# Patient Record
Sex: Female | Born: 1940 | Race: White | Hispanic: No | Marital: Married | Smoking: Never smoker
Health system: Southern US, Community
[De-identification: ages and names within clinical notes are randomized; demographics above are authoritative.]

## PROBLEM LIST (undated history)

## (undated) DIAGNOSIS — E7800 Pure hypercholesterolemia, unspecified: Secondary | ICD-10-CM

## (undated) DIAGNOSIS — I10 Essential (primary) hypertension: Secondary | ICD-10-CM

## (undated) DIAGNOSIS — M1990 Unspecified osteoarthritis, unspecified site: Secondary | ICD-10-CM

## (undated) HISTORY — PX: CATARACT EXTRACTION W/ INTRAOCULAR LENS  IMPLANT, BILATERAL: SHX1307

---

## 2014-07-23 ENCOUNTER — Ambulatory Visit: Payer: Self-pay | Admitting: Family Medicine

## 2015-01-06 ENCOUNTER — Ambulatory Visit: Payer: Self-pay | Admitting: Family Medicine

## 2016-03-15 ENCOUNTER — Other Ambulatory Visit: Payer: Self-pay | Admitting: Family Medicine

## 2016-03-15 DIAGNOSIS — Z1231 Encounter for screening mammogram for malignant neoplasm of breast: Secondary | ICD-10-CM

## 2016-03-17 ENCOUNTER — Ambulatory Visit
Admission: RE | Admit: 2016-03-17 | Discharge: 2016-03-17 | Disposition: A | Discharge status: Home / Self Care | Payer: Medicare Other | Source: Ambulatory Visit | Attending: Family Medicine | Admitting: Family Medicine

## 2016-03-17 ENCOUNTER — Other Ambulatory Visit: Payer: Self-pay | Admitting: Family Medicine

## 2016-03-17 DIAGNOSIS — Z1231 Encounter for screening mammogram for malignant neoplasm of breast: Secondary | ICD-10-CM | POA: Diagnosis present

## 2016-07-22 ENCOUNTER — Emergency Department: Payer: Medicare Other

## 2016-07-22 ENCOUNTER — Encounter: Payer: Self-pay | Admitting: Emergency Medicine

## 2016-07-22 ENCOUNTER — Emergency Department
Admission: EM | Admit: 2016-07-22 | Discharge: 2016-07-22 | Disposition: A | Discharge status: Home / Self Care | Payer: Medicare Other | Attending: Emergency Medicine | Admitting: Emergency Medicine

## 2016-07-22 DIAGNOSIS — I10 Essential (primary) hypertension: Secondary | ICD-10-CM | POA: Insufficient documentation

## 2016-07-22 DIAGNOSIS — M79622 Pain in left upper arm: Secondary | ICD-10-CM | POA: Insufficient documentation

## 2016-07-22 HISTORY — DX: Essential (primary) hypertension: I10

## 2016-07-22 HISTORY — DX: Pure hypercholesterolemia, unspecified: E78.00

## 2016-07-22 LAB — CBC
HEMATOCRIT: 40.6 % (ref 35.0–47.0)
Hemoglobin: 14.1 g/dL (ref 12.0–16.0)
MCH: 29.8 pg (ref 26.0–34.0)
MCHC: 34.8 g/dL (ref 32.0–36.0)
MCV: 85.4 fL (ref 80.0–100.0)
Platelets: 131 10*3/uL — ABNORMAL LOW (ref 150–440)
RBC: 4.75 MIL/uL (ref 3.80–5.20)
RDW: 14.7 % — AB (ref 11.5–14.5)
WBC: 9 10*3/uL (ref 3.6–11.0)

## 2016-07-22 LAB — BASIC METABOLIC PANEL
Anion gap: 8 (ref 5–15)
BUN: 27 mg/dL — AB (ref 6–20)
CHLORIDE: 107 mmol/L (ref 101–111)
CO2: 25 mmol/L (ref 22–32)
Calcium: 9.8 mg/dL (ref 8.9–10.3)
Creatinine, Ser: 1.04 mg/dL — ABNORMAL HIGH (ref 0.44–1.00)
GFR calc Af Amer: 59 mL/min — ABNORMAL LOW (ref >60)
GFR calc non Af Amer: 51 mL/min — ABNORMAL LOW (ref >60)
GLUCOSE: 101 mg/dL — AB (ref 65–99)
POTASSIUM: 4.1 mmol/L (ref 3.5–5.1)
SODIUM: 140 mmol/L (ref 135–145)

## 2016-07-22 LAB — TROPONIN I: Troponin I: <0.03 ng/mL (ref <0.03)

## 2016-07-22 MED ORDER — IBUPROFEN 400 MG PO TABS
400.0000 mg | ORAL_TABLET | Freq: Once | ORAL | Status: DC
  Filled 2016-07-22: qty 1

## 2016-07-22 NOTE — ED Triage Notes (Signed)
Pt c/o left arm pain for 2 days. Denies chest pain and SHOB. Pain worse with palpation but hurts even when not being touched. Has sharp pains inside arm

## 2016-07-22 NOTE — ED Provider Notes (Signed)
[facility] Emergency Department Provider Note  ____________________________________________   I have reviewed the triage vital signs and the nursing notes.   HISTORY  Chief Complaint Arm Pain    HPI Erin Caldwell is a 75 y.o. female who had her flu shot about 4 or 5 days ago and for the last 3 days, subsequently, she has had pain in the triceps muscle underneathwhere the flu shot was no fever no chills no nausea no vomiting no numbness no weakness no decreased pulses, no chest pain or shortness of breath. Hurts when she touches it or ranges the arm.      Past Medical History:  Diagnosis Date  . Hypercholesteremia   . Hypertension     There are no active problems to display for this patient.   History reviewed. No pertinent surgical history.  Prior to Admission medications   Not on File    Allergies Percocet [oxycodone-acetaminophen] and Tetanus toxoids  Family History  Problem Relation Age of Onset  . Breast cancer Sister     7350's  . Breast cancer Maternal Aunt     4850's    Social History Social History  Substance Use Topics  . Smoking status: Never Smoker  . Smokeless tobacco: Not on file  . Alcohol use No    Review of Systems Constitutional: No fever/chills Eyes: No visual changes. ENT: No sore throat. No stiff neck no neck pain Cardiovascular: Denies chest pain. Respiratory: Denies shortness of breath. Gastrointestinal:   no vomiting.  No diarrhea.  No constipation. Genitourinary: Negative for dysuria. Musculoskeletal: Negative lower extremity swelling Skin: Negative for rash. Neurological: Negative for severe headaches, focal weakness or numbness. 10-point ROS otherwise negative.  ____________________________________________   PHYSICAL EXAM:  VITAL SIGNS: ED Triage Vitals [07/22/16 1721]  Enc Vitals Group     BP      Pulse      Resp      Temp      Temp src      SpO2      Weight 154 lb (69.9 kg)     Height  4\' 11"  (1.499 m)     Head Circumference      Peak Flow      Pain Score 3     Pain Loc      Pain Edu?      Excl. in GC?     Constitutional: Alert and oriented. Well appearing and in no acute distress. Mouth/Throat: Mucous membranes are moist.  Oropharynx non-erythematous. Neck: No stridor.   Nontender with no meningismus Cardiovascular: Normal rate, regular rhythm. Grossly normal heart sounds.  Good peripheral circulation. Respiratory: Normal respiratory effort.  No retractions. Lungs CTAB. Abdominal: Soft and nontender. No distention. No guarding no rebound Back:  There is no focal tenderness or step off.  there is no midline tenderness there are no lesions noted. there is no CVA tenderness Musculoskeletal: No lower extremity tenderness, there is a small bruise with a flu shot was given on the left shoulder however, the area is clean dry and intact with no evidence of inflammation cellulitis or swelling, in the area underneath this, in the tricep there is tenderness to palpation. There is no redness or warmth or swelling or fluctuance or induration.. No joint effusions, no DVT signs strong distal pulses no edema. Strong pulses compartments soft.  Neurologic:  Normal speech and language. No gross focal neurologic deficits are appreciated.  Skin:  Skin is warm, dry and intact. No rash noted. Psychiatric:  Mood and affect are normal. Speech and behavior are normal.  ____________________________________________   LABS (all labs ordered are listed, but only abnormal results are displayed)  Labs Reviewed  BASIC METABOLIC PANEL - Abnormal; Notable for the following:       Result Value   Glucose, Bld 101 (*)    BUN 27 (*)    Creatinine, Ser 1.04 (*)    GFR calc non Af Amer 51 (*)    GFR calc Af Amer 59 (*)    All other components within normal limits  CBC - Abnormal; Notable for the following:    RDW 14.7 (*)    Platelets 131 (*)    All other components within normal limits  TROPONIN I    ____________________________________________  EKG  I personally interpreted any EKGs ordered by me or triage nsr rate 61 no ste no std no ischemic changes ____________________________________________  RADIOLOGY  I reviewed any imaging ordered by me or triage that were performed during my shift and, if possible, patient and/or family made aware of any abnormal findings. ____________________________________________   PROCEDURES  Procedure(s) performed: None  Procedures  Critical Care performed: None  ____________________________________________   INITIAL IMPRESSION / ASSESSMENT AND PLAN / ED COURSE  Pertinent labs & imaging results that were available during my care of the patient were reviewed by me and considered in my medical decision making (see chart for details).  Pt with arm pain after flu shot. No evidence of acs pe dissection infection or compartment syndrome. Will suggest nsaids and rest and f/u.   Clinical Course   ____________________________________________   FINAL CLINICAL IMPRESSION(S) / ED DIAGNOSES  Final diagnoses:  None      This chart was dictated using voice recognition software.  Despite best efforts to proofread,  errors can occur which can change meaning.      Jeanmarie Plant, MD 07/22/16 2022

## 2016-08-12 ENCOUNTER — Ambulatory Visit (INDEPENDENT_AMBULATORY_CARE_PROVIDER_SITE_OTHER): Payer: Medicare Other | Admitting: Vascular Surgery

## 2016-08-12 ENCOUNTER — Encounter (INDEPENDENT_AMBULATORY_CARE_PROVIDER_SITE_OTHER): Payer: Self-pay | Admitting: Vascular Surgery

## 2016-08-12 DIAGNOSIS — I6523 Occlusion and stenosis of bilateral carotid arteries: Secondary | ICD-10-CM | POA: Diagnosis not present

## 2016-08-12 DIAGNOSIS — I10 Essential (primary) hypertension: Secondary | ICD-10-CM

## 2016-08-12 DIAGNOSIS — I739 Peripheral vascular disease, unspecified: Secondary | ICD-10-CM | POA: Diagnosis not present

## 2016-08-12 DIAGNOSIS — E7800 Pure hypercholesterolemia, unspecified: Secondary | ICD-10-CM

## 2016-08-13 DIAGNOSIS — I6529 Occlusion and stenosis of unspecified carotid artery: Secondary | ICD-10-CM | POA: Insufficient documentation

## 2016-08-13 DIAGNOSIS — I872 Venous insufficiency (chronic) (peripheral): Secondary | ICD-10-CM | POA: Insufficient documentation

## 2016-08-13 DIAGNOSIS — I10 Essential (primary) hypertension: Secondary | ICD-10-CM | POA: Insufficient documentation

## 2016-08-13 DIAGNOSIS — E7800 Pure hypercholesterolemia, unspecified: Secondary | ICD-10-CM | POA: Insufficient documentation

## 2016-08-13 NOTE — Progress Notes (Signed)
MRN : [id number]  Erin Caldwell is a 75 y.o. (May 28, 1941) female who presents with chief complaint of  Chief Complaint  Patient presents with  . New Patient (Initial Visit)  .  History of Present Illness: The patient is sent to me by Dr. Alphonzo LemmingsMcShane for evaluation of atherosclerotic changes noted on chest x-ray.  She presented to the emergency room several days ago with increasing pain of the right arm after she had a flu shot. As part of that evaluation the patient received a chest x-ray and atherosclerotic changes that is calcifications were noted of the aortic arch.  Patient denies back pain or chest pain. She denies claudication symptoms. She also denies amaurosis fugax, TIA no prior history of CVA.  No past vascular interventions. She does attest to a remote carotid ultrasound which demonstrated "some blockage".  Current Outpatient Prescriptions  Medication Sig Dispense Refill  . lisinopril (PRINIVIL,ZESTRIL) 10 MG tablet Take 10 mg by mouth daily.    Marland Kitchen. aspirin EC 81 MG tablet Take by mouth.    . calcium carbonate (CALCIUM 600) 600 MG TABS tablet Take by mouth.    . simvastatin (ZOCOR) 20 MG tablet     . TOPROL XL 100 MG 24 hr tablet      No current facility-administered medications for this visit.     Past Medical History:  Diagnosis Date  . Hypercholesteremia   . Hypertension     No past surgical history on file.  Social History Social History  Substance Use Topics  . Smoking status: Never Smoker  . Smokeless tobacco: Never Used  . Alcohol use No     Family History Family History  Problem Relation Age of Onset  . Breast cancer Sister     2650's  . Breast cancer Maternal Aunt     50's   No family history of bleeding clotting disorders, porphyria or autoimmune disease.  Allergies  Allergen Reactions  . Percocet [Oxycodone-Acetaminophen] Other (See Comments)    hallucinations  . Tetanus Toxoids Swelling     REVIEW OF SYSTEMS (Negative unless  checked)  Constitutional: [] Weight loss  [] Fever  [] Chills Cardiac: [] Chest pain   [] Chest pressure   [] Palpitations   [] Shortness of breath at rest   [] Shortness of breath with exertion. Vascular:  [] Pain in legs with walking   [] Pain in legs at rest    [] Pain in feet at rest    [] History of DVT   [] Phlebitis   [] Swelling in legs   [] Varicose veins   [] Non-healing ulcers Pulmonary:   [] Uses home oxygen   [] Productive cough   [] Hemoptysis   [] Wheeze  [] COPD   [] Asthma Neurologic:  [] Dizziness  [] Blackouts   [] Seizures   [] History of stroke   [] History of TIA  [] Aphasia   [] Temporary blindness   [] Dysphagia   [] Weakness or numbness in arm   [] Weakness or numbness in leg Musculoskeletal:  [x] Arthritis   [] Joint swelling   [] Joint pain   [] Low back pain Hematologic:  [] Easy bruising  [] Easy bleeding   [] Hypercoagulable state   [] Anemic   Gastrointestinal:  [] Blood in stool   [] Vomiting blood  [] Gastroesophageal reflux/heartburn   [] Difficulty swallowing. Genitourinary:  [] Chronic kidney disease   [] Difficult urination  [] Frequent urination  [] Burning with urination   [] Blood in urine Skin:  [] Rashes   [] Ulcers   Psychological:  [] History of anxiety   []  History of major depression.    Physical Examination  Vitals:   08/12/16 1359  BP: Marland Kitchen(!)  153/66  Pulse: (!) 52  Resp: 16  Weight: 161 lb (73 kg)  Height: 4\' 11"  (1.499 m)   Body mass index is 32.52 kg/m. Gen:  WD/WN, NAD Head: [place]/AT, No temporalis wasting. Ear/Nose/Throat: Hearing grossly intact, nares w/o erythema or drainage, trachea midline Eyes: PERR, EOM appear normal. Sclera non-icteric Neck: Supple, no nuchal rigidity.  No bruit or JVD.  Pulmonary:  Good air movement, equal and clear to auscultation bilaterally.  Cardiac: RRR, normal S1, S2, 3/6 systolic Murmurs, rubs or gallops. Vascular:   Bilateral carotid bruit Vessel Right Left  Radial Palpable Palpable  Ulnar Palpable Palpable  Brachial Palpable Palpable  Carotid  Palpable Palpable  Aorta Not palpable N/A  Femoral Palpable Palpable  Popliteal Palpable Palpable  PT 1+ Palpable 1+ Palpable  DP 2+ Palpable 2+ Palpable   Gastrointestinal: soft, non-rigid/non-distended. No guarding.  Musculoskeletal: M/S 5/5 throughout.  No deformity or atrophy. Neurologic: CN 2-12 intact. Pain and light touch intact in extremities.  Speech is fluent. Motor exam as listed above. Psychiatric: Judgment intact, Mood & affect appropriate for pt's clinical situation. Dermatologic: No rashes or ulcers noted.  No signs consistent with cellulitis. Lymphatic:  No cervical lymphadenopathy  CBC Lab Results  Component Value Date   WBC 9.0 07/22/2016   HGB 14.1 07/22/2016   HCT 40.6 07/22/2016   MCV 85.4 07/22/2016   PLT 131 (L) 07/22/2016    BMET    Component Value Date/Time   NA 140 07/22/2016 1730   K 4.1 07/22/2016 1730   CL 107 07/22/2016 1730   CO2 25 07/22/2016 1730   GLUCOSE 101 (H) 07/22/2016 1730   BUN 27 (H) 07/22/2016 1730   CREATININE 1.04 (H) 07/22/2016 1730   CALCIUM 9.8 07/22/2016 1730   GFRNONAA 51 (L) 07/22/2016 1730   GFRAA 59 (L) 07/22/2016 1730   CrCl cannot be calculated (Patient's most recent lab result is older than the maximum 21 days allowed.).  COAG No results found for: INR, PROTIME  Radiology Dg Chest 2 View  Result Date: 07/22/2016 CLINICAL DATA:  Left arm pain for 2 days. EXAM: CHEST  2 VIEW COMPARISON:  None. FINDINGS: The heart size and mediastinal contours are within normal limits. Aortic atherosclerosis. Both lungs are clear. No evidence of pneumothorax or pleural effusion. Upper and mid thoracic vertebral body compression fracture deformities are noted which are likely old. IMPRESSION: No active cardiopulmonary disease. Aortic atherosclerosis. Probable old upper and mid thoracic vertebral body compression deformities. Electronically Signed   By: Myles Rosenthal M.D.   On: 07/22/2016 18:05      Assessment/Plan 1.   Atherosclerotic occlusive disease nonspecific: Patient does not have symptoms related to her peripheral vascular disease. Although there is mild changes noted on x-ray given the palpable pulses I would not move forward with any further invasive testing. I did discuss the possibility of CT angiogram with her however we're in agreement that this is not needed at this time. Patient will continue antiplatelet therapy aggressive control of cholesterol with statins. She will begin exercise.  2.  Atherosclerotic occlusive disease bilateral carotid arteries: Patient is overdue for duplex ultrasound of the carotid arteries. Carotid ultrasound will be ordered. As noted above she is already on medical management for atherosclerotic occlusive disease and this will continue. She was educated regarding symptoms and signs of carotid stenosis including amaurosis fugax TIA.  3.  Hypertension: Patient will continue her medications. Her blood pressure was mildly elevated and I have encouraged her to have a recheck  and her medications adjusted based on a follow-up I would not jump to changes based on 1 value.  4.  Hypercholesterolemia: Patient will continue her statin therapy.   Levora Dredge, MD  08/13/2016 1:22 PM    This note was created with Dragon medical transcription system.  Any errors from dictation are purely unintentional

## 2016-09-06 ENCOUNTER — Other Ambulatory Visit (INDEPENDENT_AMBULATORY_CARE_PROVIDER_SITE_OTHER): Payer: Self-pay | Admitting: Vascular Surgery

## 2016-09-06 DIAGNOSIS — I6523 Occlusion and stenosis of bilateral carotid arteries: Secondary | ICD-10-CM

## 2016-09-09 ENCOUNTER — Encounter (INDEPENDENT_AMBULATORY_CARE_PROVIDER_SITE_OTHER): Payer: Self-pay | Admitting: Vascular Surgery

## 2016-09-09 ENCOUNTER — Ambulatory Visit (INDEPENDENT_AMBULATORY_CARE_PROVIDER_SITE_OTHER): Payer: Medicare Other | Admitting: Vascular Surgery

## 2016-09-09 ENCOUNTER — Ambulatory Visit (INDEPENDENT_AMBULATORY_CARE_PROVIDER_SITE_OTHER): Payer: Medicare Other

## 2016-09-09 VITALS — BP 179/79 | HR 47 | Resp 16 | Ht 59.0 in | Wt 159.0 lb

## 2016-09-09 DIAGNOSIS — I739 Peripheral vascular disease, unspecified: Secondary | ICD-10-CM | POA: Diagnosis not present

## 2016-09-09 DIAGNOSIS — E7800 Pure hypercholesterolemia, unspecified: Secondary | ICD-10-CM | POA: Diagnosis not present

## 2016-09-09 DIAGNOSIS — I6523 Occlusion and stenosis of bilateral carotid arteries: Secondary | ICD-10-CM | POA: Diagnosis not present

## 2016-09-09 DIAGNOSIS — I10 Essential (primary) hypertension: Secondary | ICD-10-CM

## 2016-09-09 NOTE — Progress Notes (Signed)
[facility] Admission History & Physical  MRN : [id number]  Erin Caldwell is a 75 y.o. ([birth year]) female who presents with chief complaint of  Chief Complaint  Patient presents with  . Follow-up  .  History of Present Illness: The patient is seen for follow up evaluation of carotid stenosis. The carotid stenosis followed by ultrasound.   The patient denies amaurosis fugax. There is no recent history of TIA symptoms or focal motor deficits. There is no prior documented CVA.  The patient is taking enteric-coated aspirin 81 mg daily.  There is no history of migraine headaches. There is no history of seizures.  The patient has a history of coronary artery disease, no recent episodes of angina or shortness of breath. The patient denies PAD or claudication symptoms. There is a history of hyperlipidemia which is being treated with a statin.    Carotid Duplex done today shows <30% bilateral ICA stenosis antegrade vertebral arteries bilaterally.    Current Meds  Medication Sig  . aspirin EC 81 MG tablet Take by mouth.  . calcium carbonate (CALCIUM 600) 600 MG TABS tablet Take by mouth.  Marland Kitchen. lisinopril (PRINIVIL,ZESTRIL) 10 MG tablet Take 10 mg by mouth daily.  . simvastatin (ZOCOR) 20 MG tablet   . TOPROL XL 100 MG 24 hr tablet     Past Medical History:  Diagnosis Date  . Hypercholesteremia   . Hypertension     No past surgical history on file.  Social History Social History  Substance Use Topics  . Smoking status: Never Smoker  . Smokeless tobacco: Never Used  . Alcohol use No    Family History Family History  Problem Relation Age of Onset  . Breast cancer Sister     4450's  . Breast cancer Maternal Aunt     50's    Allergies  Allergen Reactions  . Percocet [Oxycodone-Acetaminophen] Other (See Comments)    hallucinations  . Tetanus Toxoids Swelling     REVIEW OF SYSTEMS (Negative unless checked)  Constitutional: [] Weight loss   [] Fever  [] Chills Cardiac: [] Chest pain   [] Chest pressure   [] Palpitations   [] Shortness of breath when laying flat   [] Shortness of breath with exertion. Vascular:  [] Pain in legs with walking   [] Pain in legs at rest  [] History of DVT   [] Phlebitis   [] Swelling in legs   [] Varicose veins   [] Non-healing ulcers Pulmonary:   [] Uses home oxygen   [] Productive cough   [] Hemoptysis   [] Wheeze  [] COPD   [] Asthma Neurologic:  [] Dizziness   [] Seizures   [] History of stroke   [] History of TIA  [] Aphasia   [] Vissual changes   [] Weakness or numbness in arm   [] Weakness or numbness in leg Musculoskeletal:   [] Joint swelling   [] Joint pain   [] Low back pain Hematologic:  [] Easy bruising  [] Easy bleeding   [] Hypercoagulable state   [] Anemic Gastrointestinal:  [] Diarrhea   [] Vomiting  [] Gastroesophageal reflux/heartburn   [] Difficulty swallowing. Genitourinary:  [] Chronic kidney disease   [] Difficult urination  [] Frequent urination   [] Blood in urine Skin:  [] Rashes   [] Ulcers  Psychological:  [] History of anxiety   []  History of major depression.  Physical Examination  Vitals:   09/09/16 1121  BP: (!) 179/79  Pulse: (!) 47  Resp: 16  Weight: 159 lb (72.1 kg)  Height: 4\' 11"  (1.499 m)   Body mass index is 32.11 kg/m.   Gen:  WD/WN, NAD Head: [place]/AT, No temporalis wasting.  Ear/Nose/Throat: Hearing grossly intact, nares w/o erythema or drainage, trachea midline Eyes: PERR, EOM appear normal. Sclera non-icteric Neck: Supple, no nuchal rigidity.  No bruit or JVD.  Pulmonary:  Good air movement, equal and clear to auscultation bilaterally.  Cardiac: RRR, normal S1, S2, 3/6 systolic Murmurs, rubs or gallops. Vascular:   Bilateral carotid bruit Vessel Right Left  Radial Palpable Palpable  Ulnar Palpable Palpable  Brachial Palpable Palpable  Carotid Palpable Palpable  Aorta Not palpable N/A  Femoral Palpable Palpable  Popliteal Palpable Palpable  PT 1+ Palpable 1+ Palpable  DP 2+ Palpable 2+  Palpable   Gastrointestinal: soft, non-rigid/non-distended. No guarding.  Musculoskeletal: M/S 5/5 throughout.  No deformity or atrophy. Neurologic: CN 2-12 intact. Pain and light touch intact in extremities.  Speech is fluent. Motor exam as listed above. Psychiatric: Judgment intact, Mood & affect appropriate for pt's clinical situation. Dermatologic: No rashes or ulcers noted.  No signs consistent with cellulitis. Lymphatic:  No cervical lymphadenopathy    CBC Lab Results  Component Value Date   WBC 9.0 07/22/2016   HGB 14.1 07/22/2016   HCT 40.6 07/22/2016   MCV 85.4 07/22/2016   PLT 131 (L) 07/22/2016    BMET    Component Value Date/Time   NA 140 07/22/2016 1730   K 4.1 07/22/2016 1730   CL 107 07/22/2016 1730   CO2 25 07/22/2016 1730   GLUCOSE 101 (H) 07/22/2016 1730   BUN 27 (H) 07/22/2016 1730   CREATININE 1.04 (H) 07/22/2016 1730   CALCIUM 9.8 07/22/2016 1730   GFRNONAA 51 (L) 07/22/2016 1730   GFRAA 59 (L) 07/22/2016 1730   CrCl cannot be calculated (Patient's most recent lab result is older than the maximum 21 days allowed.).  COAG No results found for: INR, PROTIME  Radiology No results found.   Assessment/Plan 1.  Atherosclerotic occlusive disease nonspecific: Patient does not have symptoms related to her peripheral vascular disease. Although there is mild changes noted on x-ray given the palpable pulses I would not move forward with any further invasive testing. I did discuss the possibility of CT angiogram with her however we're in agreement that this is not needed at this time. Patient will continue antiplatelet therapy aggressive control of cholesterol with statins. She will begin exercise.  2.  Atherosclerotic occlusive disease bilateral carotid arteries:  She has minimal carotid plaque and this can be followed as needed  3.  Hypertension: Patient will continue her medications. Her blood pressure was mildly elevated and I have encouraged her to  have a recheck and her medications adjusted based on a follow-up I would not jump to changes based on 1 value.  4.  Hypercholesterolemia: Patient will continue her statin therapy.   Levora DredgeGregory Jaysion Ramseyer, MD  09/09/2016 11:59 AM

## 2016-12-07 ENCOUNTER — Other Ambulatory Visit: Payer: Self-pay | Admitting: Family Medicine

## 2016-12-07 DIAGNOSIS — S22000A Wedge compression fracture of unspecified thoracic vertebra, initial encounter for closed fracture: Secondary | ICD-10-CM

## 2016-12-15 ENCOUNTER — Ambulatory Visit
Admission: RE | Admit: 2016-12-15 | Discharge: 2016-12-15 | Disposition: A | Discharge status: Home / Self Care | Payer: Medicare Other | Source: Ambulatory Visit | Attending: Family Medicine | Admitting: Family Medicine

## 2016-12-15 DIAGNOSIS — M4854XA Collapsed vertebra, not elsewhere classified, thoracic region, initial encounter for fracture: Secondary | ICD-10-CM | POA: Diagnosis present

## 2016-12-15 DIAGNOSIS — S22000A Wedge compression fracture of unspecified thoracic vertebra, initial encounter for closed fracture: Secondary | ICD-10-CM

## 2016-12-22 ENCOUNTER — Encounter
Admission: RE | Admit: 2016-12-22 | Discharge: 2016-12-22 | Disposition: A | Discharge status: Home / Self Care | Payer: Medicare Other | Source: Ambulatory Visit | Attending: Orthopedic Surgery | Admitting: Orthopedic Surgery

## 2016-12-22 DIAGNOSIS — Z7982 Long term (current) use of aspirin: Secondary | ICD-10-CM | POA: Diagnosis not present

## 2016-12-22 DIAGNOSIS — M810 Age-related osteoporosis without current pathological fracture: Secondary | ICD-10-CM | POA: Diagnosis not present

## 2016-12-22 DIAGNOSIS — S22060A Wedge compression fracture of T7-T8 vertebra, initial encounter for closed fracture: Secondary | ICD-10-CM | POA: Diagnosis not present

## 2016-12-22 DIAGNOSIS — M4854XA Collapsed vertebra, not elsewhere classified, thoracic region, initial encounter for fracture: Secondary | ICD-10-CM | POA: Diagnosis present

## 2016-12-22 DIAGNOSIS — X58XXXA Exposure to other specified factors, initial encounter: Secondary | ICD-10-CM | POA: Diagnosis not present

## 2016-12-22 DIAGNOSIS — Z01812 Encounter for preprocedural laboratory examination: Secondary | ICD-10-CM

## 2016-12-22 DIAGNOSIS — Z79899 Other long term (current) drug therapy: Secondary | ICD-10-CM | POA: Diagnosis not present

## 2016-12-22 DIAGNOSIS — E785 Hyperlipidemia, unspecified: Secondary | ICD-10-CM | POA: Diagnosis not present

## 2016-12-22 DIAGNOSIS — E7800 Pure hypercholesterolemia, unspecified: Secondary | ICD-10-CM | POA: Diagnosis not present

## 2016-12-22 DIAGNOSIS — I10 Essential (primary) hypertension: Secondary | ICD-10-CM | POA: Diagnosis not present

## 2016-12-22 HISTORY — DX: Unspecified osteoarthritis, unspecified site: M19.90

## 2016-12-22 LAB — BASIC METABOLIC PANEL
Anion gap: 6 (ref 5–15)
BUN: 26 mg/dL — AB (ref 6–20)
CHLORIDE: 107 mmol/L (ref 101–111)
CO2: 29 mmol/L (ref 22–32)
Calcium: 9.6 mg/dL (ref 8.9–10.3)
Creatinine, Ser: 1.05 mg/dL — ABNORMAL HIGH (ref 0.44–1.00)
GFR calc Af Amer: 58 mL/min — ABNORMAL LOW (ref >60)
GFR calc non Af Amer: 50 mL/min — ABNORMAL LOW (ref >60)
Glucose, Bld: 92 mg/dL (ref 65–99)
POTASSIUM: 4.4 mmol/L (ref 3.5–5.1)
SODIUM: 142 mmol/L (ref 135–145)

## 2016-12-22 LAB — CBC
HEMATOCRIT: 39 % (ref 35.0–47.0)
HEMOGLOBIN: 13.1 g/dL (ref 12.0–16.0)
MCH: 28.5 pg (ref 26.0–34.0)
MCHC: 33.6 g/dL (ref 32.0–36.0)
MCV: 84.9 fL (ref 80.0–100.0)
Platelets: 158 10*3/uL (ref 150–440)
RBC: 4.6 MIL/uL (ref 3.80–5.20)
RDW: 14.4 % (ref 11.5–14.5)
WBC: 7.9 10*3/uL (ref 3.6–11.0)

## 2016-12-22 LAB — SURGICAL PCR SCREEN
MRSA, PCR: NEGATIVE
Staphylococcus aureus: NEGATIVE

## 2016-12-22 MED ORDER — CEFAZOLIN SODIUM-DEXTROSE 2-4 GM/100ML-% IV SOLN
2.0000 g | Freq: Once | INTRAVENOUS | Status: AC
  Administered 2016-12-23: 2 g via INTRAVENOUS

## 2016-12-22 NOTE — Patient Instructions (Signed)
  Your procedure is scheduled on: 12/23/16 Thurs  Report to Same Day Surgery 2nd floor medical mall [facility](Medical Mall Entrance-take elevator on left to 2nd floor.  Check in with surgery information desk.) arrive at 9:50am    Remember: Instructions that are not followed completely may result in serious medical risk, up to and including death, or upon the discretion of your surgeon and anesthesiologist your surgery may need to be rescheduled.    _x___ 1. Do not eat food or drink liquids after midnight. No gum chewing or hard candies.     __x__ 2. No Alcohol for 24 hours before or after surgery.   __x__3. No Smoking for 24 prior to surgery.   ____  4. Bring all medications with you on the day of surgery if instructed.    __x__ 5. Notify your doctor if there is any change in your medical condition     (cold, fever, infections).     Do not wear jewelry, make-up, hairpins, clips or nail polish.  Do not wear lotions, powders, or perfumes. You may wear deodorant.  Do not shave 48 hours prior to surgery. Men may shave face and neck.  Do not bring valuables to the hospital.    [facility] is not responsible for any belongings or valuables.               Contacts, dentures or bridgework may not be worn into surgery.  Leave your suitcase in the car. After surgery it may be brought to your room.  For patients admitted to the hospital, discharge time is determined by your treatment team.   Patients discharged the day of surgery will not be allowed to drive home.  You will need someone to drive you home and stay with you the night of your procedure.    Please read over the following fact sheets that you were given:   [facility] Preparing for [facility] and or MRSA Information   _x___ Take these medicines the morning of surgery with A SIP OF WATER:    1. lisinopril (PRINIVIL,ZESTRIL) 10   2.  3.  4.  5.  6.  ____Fleets enema or Magnesium Citrate as directed.   _x___ Use CHG Soap or sage wipes as  directed on instruction sheet   ____ Use inhalers on the day of surgery and bring to hospital day of surgery  ____ Stop metformin 2 days prior to surgery    ____ Take 1/2 of usual insulin dose the night before surgery and none on the morning of           surgery.   _x___ Stop Aspirin, Coumadin, Pllavix ,Eliquis, Effient, or Pradaxa  Stopped Aspirin and meloxicam a week ago  x__ Stop Anti-inflammatories such as Advil, Aleve, Ibuprofen, Motrin, Naproxen,          Naprosyn, Goodies powders or aspirin products. Ok to take Tylenol.  Stopped naproxen yesterday.   ____ Stop supplements until after surgery.    ____ Bring C-Pap to the hospital.

## 2016-12-23 ENCOUNTER — Ambulatory Visit: Payer: Medicare Other | Admitting: Anesthesiology

## 2016-12-23 ENCOUNTER — Encounter
Admission: RE | Disposition: A | Discharge status: Home / Self Care | Payer: Self-pay | Source: Ambulatory Visit | Attending: Orthopedic Surgery

## 2016-12-23 ENCOUNTER — Ambulatory Visit
Admission: RE | Admit: 2016-12-23 | Discharge: 2016-12-23 | Disposition: A | Discharge status: Home / Self Care | Payer: Medicare Other | Source: Ambulatory Visit | Attending: Orthopedic Surgery | Admitting: Orthopedic Surgery

## 2016-12-23 ENCOUNTER — Ambulatory Visit: Payer: Medicare Other

## 2016-12-23 ENCOUNTER — Encounter: Payer: Self-pay | Admitting: *Deleted

## 2016-12-23 DIAGNOSIS — E785 Hyperlipidemia, unspecified: Secondary | ICD-10-CM | POA: Insufficient documentation

## 2016-12-23 DIAGNOSIS — S22060A Wedge compression fracture of T7-T8 vertebra, initial encounter for closed fracture: Secondary | ICD-10-CM | POA: Diagnosis not present

## 2016-12-23 DIAGNOSIS — Z79899 Other long term (current) drug therapy: Secondary | ICD-10-CM | POA: Insufficient documentation

## 2016-12-23 DIAGNOSIS — E7800 Pure hypercholesterolemia, unspecified: Secondary | ICD-10-CM | POA: Insufficient documentation

## 2016-12-23 DIAGNOSIS — Z7982 Long term (current) use of aspirin: Secondary | ICD-10-CM | POA: Insufficient documentation

## 2016-12-23 DIAGNOSIS — Z419 Encounter for procedure for purposes other than remedying health state, unspecified: Secondary | ICD-10-CM

## 2016-12-23 DIAGNOSIS — M810 Age-related osteoporosis without current pathological fracture: Secondary | ICD-10-CM | POA: Insufficient documentation

## 2016-12-23 DIAGNOSIS — I10 Essential (primary) hypertension: Secondary | ICD-10-CM | POA: Insufficient documentation

## 2016-12-23 DIAGNOSIS — X58XXXA Exposure to other specified factors, initial encounter: Secondary | ICD-10-CM | POA: Insufficient documentation

## 2016-12-23 HISTORY — PX: KYPHOPLASTY: SHX5884

## 2016-12-23 SURGERY — KYPHOPLASTY
Anesthesia: General | Wound class: Clean

## 2016-12-23 MED ORDER — MIDAZOLAM HCL 2 MG/2ML IJ SOLN
INTRAMUSCULAR | Status: AC
  Filled 2016-12-23: qty 2

## 2016-12-23 MED ORDER — LACTATED RINGERS IV SOLN
INTRAVENOUS | Status: DC
  Administered 2016-12-23: 13:00:00 via INTRAVENOUS

## 2016-12-23 MED ORDER — LIDOCAINE HCL (PF) 1 % IJ SOLN
INTRAMUSCULAR | Status: DC | PRN
  Administered 2016-12-23 (×2): 10 mL

## 2016-12-23 MED ORDER — IOPAMIDOL (ISOVUE-M 200) INJECTION 41%
INTRAMUSCULAR | Status: AC
  Filled 2016-12-23: qty 20

## 2016-12-23 MED ORDER — FENTANYL CITRATE (PF) 100 MCG/2ML IJ SOLN: 25.0000 ug | INTRAMUSCULAR | Status: DC | PRN

## 2016-12-23 MED ORDER — BUPIVACAINE-EPINEPHRINE (PF) 0.5% -1:200000 IJ SOLN
INTRAMUSCULAR | Status: AC
  Filled 2016-12-23: qty 30

## 2016-12-23 MED ORDER — FENTANYL CITRATE (PF) 100 MCG/2ML IJ SOLN
INTRAMUSCULAR | Status: DC | PRN
  Administered 2016-12-23: 25 ug via INTRAVENOUS
  Administered 2016-12-23: 50 ug via INTRAVENOUS
  Administered 2016-12-23: 25 ug via INTRAVENOUS

## 2016-12-23 MED ORDER — LIDOCAINE HCL (PF) 1 % IJ SOLN
INTRAMUSCULAR | Status: AC
  Filled 2016-12-23: qty 60

## 2016-12-23 MED ORDER — ONDANSETRON HCL 4 MG/2ML IJ SOLN: 4.0000 mg | Freq: Once | INTRAMUSCULAR | Status: DC | PRN

## 2016-12-23 MED ORDER — HYDROCODONE-ACETAMINOPHEN 5-325 MG PO TABS: 1.0000 | ORAL_TABLET | ORAL | Status: DC | PRN

## 2016-12-23 MED ORDER — MIDAZOLAM HCL 2 MG/2ML IJ SOLN
INTRAMUSCULAR | Status: DC | PRN
  Administered 2016-12-23 (×2): 1 mg via INTRAVENOUS

## 2016-12-23 MED ORDER — HYDROCODONE-ACETAMINOPHEN 5-325 MG PO TABS: 1.0000 | ORAL_TABLET | Freq: Four times a day (QID) | ORAL | 0 refills | Status: AC | PRN

## 2016-12-23 MED ORDER — PROPOFOL 500 MG/50ML IV EMUL
INTRAVENOUS | Status: AC
  Filled 2016-12-23: qty 50

## 2016-12-23 MED ORDER — PROPOFOL 500 MG/50ML IV EMUL
INTRAVENOUS | Status: DC | PRN
  Administered 2016-12-23: 50 ug/kg/min via INTRAVENOUS

## 2016-12-23 MED ORDER — FAMOTIDINE 20 MG PO TABS
ORAL_TABLET | ORAL | Status: AC
  Filled 2016-12-23: qty 1

## 2016-12-23 MED ORDER — METOCLOPRAMIDE HCL 10 MG PO TABS: 5.0000 mg | ORAL_TABLET | Freq: Three times a day (TID) | ORAL | Status: DC | PRN

## 2016-12-23 MED ORDER — LIDOCAINE HCL (PF) 2 % IJ SOLN
INTRAMUSCULAR | Status: AC
  Filled 2016-12-23: qty 2

## 2016-12-23 MED ORDER — METOCLOPRAMIDE HCL 5 MG/ML IJ SOLN: 5.0000 mg | Freq: Three times a day (TID) | INTRAMUSCULAR | Status: DC | PRN

## 2016-12-23 MED ORDER — CEFAZOLIN SODIUM-DEXTROSE 2-4 GM/100ML-% IV SOLN
INTRAVENOUS | Status: AC
  Filled 2016-12-23: qty 100

## 2016-12-23 MED ORDER — ONDANSETRON HCL 4 MG PO TABS: 4.0000 mg | ORAL_TABLET | Freq: Four times a day (QID) | ORAL | Status: DC | PRN

## 2016-12-23 MED ORDER — BUPIVACAINE-EPINEPHRINE (PF) 0.5% -1:200000 IJ SOLN
INTRAMUSCULAR | Status: DC | PRN
  Administered 2016-12-23: 10 mL

## 2016-12-23 MED ORDER — LIDOCAINE HCL (CARDIAC) 20 MG/ML IV SOLN
INTRAVENOUS | Status: DC | PRN
  Administered 2016-12-23: 60 mg via INTRAVENOUS

## 2016-12-23 MED ORDER — PHENYLEPHRINE HCL 10 MG/ML IJ SOLN
INTRAMUSCULAR | Status: DC | PRN
  Administered 2016-12-23: 50 ug via INTRAVENOUS

## 2016-12-23 MED ORDER — ONDANSETRON HCL 4 MG/2ML IJ SOLN: 4.0000 mg | Freq: Four times a day (QID) | INTRAMUSCULAR | Status: DC | PRN

## 2016-12-23 MED ORDER — SODIUM CHLORIDE 0.9 % IV SOLN: INTRAVENOUS | Status: DC

## 2016-12-23 MED ORDER — FAMOTIDINE 20 MG PO TABS
20.0000 mg | ORAL_TABLET | Freq: Once | ORAL | Status: AC
  Administered 2016-12-23: 20 mg via ORAL

## 2016-12-23 MED ORDER — FENTANYL CITRATE (PF) 100 MCG/2ML IJ SOLN
INTRAMUSCULAR | Status: AC
  Filled 2016-12-23: qty 2

## 2016-12-23 SURGICAL SUPPLY — 21 items
CEMENT BONE KYPHON CDS (Cement) ×2 IMPLANT
CEMENT KYPHON CX01A KIT/MIXER (Cement) ×2 IMPLANT
DECANTER SPIKE VIAL GLASS SM (MISCELLANEOUS) ×8 IMPLANT
DERMABOND ADVANCED (GAUZE/BANDAGES/DRESSINGS) ×1
DERMABOND ADVANCED .7 DNX12 (GAUZE/BANDAGES/DRESSINGS) ×1 IMPLANT
DEVICE BIOPSY BONE KYPHX (INSTRUMENTS) ×2 IMPLANT
DRAPE C-ARM XRAY 36X54 (DRAPES) ×2 IMPLANT
DURAPREP 26ML APPLICATOR (WOUND CARE) ×2 IMPLANT
GLOVE BIO SURGEON STRL SZ7 (GLOVE) ×4 IMPLANT
GLOVE SURG SYN 9.0  PF PI (GLOVE) ×1
GLOVE SURG SYN 9.0 PF PI (GLOVE) ×1 IMPLANT
GOWN SRG 2XL LVL 4 RGLN SLV (GOWNS) ×1 IMPLANT
GOWN STRL NON-REIN 2XL LVL4 (GOWNS) ×1
GOWN STRL REUS W/ TWL LRG LVL3 (GOWN DISPOSABLE) ×1 IMPLANT
GOWN STRL REUS W/TWL LRG LVL3 (GOWN DISPOSABLE) ×1
LIQUID BAND (GAUZE/BANDAGES/DRESSINGS) IMPLANT
PACK KYPHOPLASTY (MISCELLANEOUS) ×2 IMPLANT
STRAP SAFETY BODY (MISCELLANEOUS) ×2 IMPLANT
TRAY KYPHOPAK 15/2 EXPRESS (KITS) ×2 IMPLANT
TRAY KYPHOPAK 15/3 EXPRESS 1ST (MISCELLANEOUS) ×2 IMPLANT
TRAY KYPHOPAK 20/3 EXPRESS 1ST (MISCELLANEOUS) IMPLANT

## 2016-12-23 NOTE — OR Nursing (Signed)
Unable to find pain med Norco prescription found on chart.  Pt reports she does not want a pain prescription medication due to Hallucinations she exerienced when taking Percoset.  Pt plans to take Aleve for pain.

## 2016-12-23 NOTE — Discharge Instructions (Addendum)
Remove Band-Aid on Saturday. Activities as tolerated starting tomorrow but try to avoid bending a great deal or lifting over 5 pounds   AMBULATORY SURGERY  DISCHARGE INSTRUCTIONS   1) The drugs that you were given will stay in your system until tomorrow so for the next 24 hours you should not:  A) Drive an automobile B) Make any legal decisions C) Drink any alcoholic beverage   2) You may resume regular meals tomorrow.  Today it is better to start with liquids and gradually work up to solid foods.  You may eat anything you prefer, but it is better to start with liquids, then soup and crackers, and gradually work up to solid foods.   3) Please notify your doctor immediately if you have any unusual bleeding, trouble breathing, redness and pain at the surgery site, drainage, fever, or pain not relieved by medication.   4) Additional Instructions:Sponge bath until Saturday, then may shower, no tub bathing, no swimming or hot tubs at this time.

## 2016-12-23 NOTE — Transfer of Care (Signed)
Immediate Anesthesia Transfer of Care Note  Patient: Erin Caldwell  Procedure(s) Performed: Procedure(s) with comments: KYPHOPLASTY (N/A) - T8  Patient Location: PACU  Anesthesia Type:General  Level of Consciousness: awake and patient cooperative  Airway & Oxygen Therapy: Patient Spontanous Breathing  Post-op Assessment: Report given to RN and Post -op Vital signs reviewed and stable  Post vital signs: Reviewed and stable  Last Vitals:  Vitals:   12/23/16 1218 12/23/16 1519  BP: (!) 165/45 132/69  Pulse: 63 82  Resp: 16 18  Temp: 36.4 C 36.2 C    Last Pain:  Vitals:   12/23/16 1218  TempSrc: Oral  PainSc: 2          Complications: No apparent anesthesia complications

## 2016-12-23 NOTE — Op Note (Signed)
12/23/2016  3:21 PM  PATIENT:  Erin Caldwell  76 y.o. female  PRE-OPERATIVE DIAGNOSIS:  closed wedge compression fracture T8  POST-OPERATIVE DIAGNOSIS:  closed wedge compression fracture T8  PROCEDURE:  Procedure(s) with comments: KYPHOPLASTY (N/A) - T8  SURGEON: Laurene Footman, MD  ASSISTANTS: None   ANESTHESIA:   local and MAC  EBL:  Total I/O In: 600 [I.V.:600] Out: 2 [Blood:2]  BLOOD ADMINISTERED:none  DRAINS: none   LOCAL MEDICATIONS USED:  MARCAINE    and XYLOCAINE   SPECIMEN:  Source of Specimen:  T8 vertebral body  DISPOSITION OF SPECIMEN:  PATHOLOGY  COUNTS:  YES  TOURNIQUET:  * No tourniquets in log *  Implants: Bone cement   DICTATION: .Dragon Dictation  patient brought the operating room and after adequate sedation was given, patient placed prone. C-arm was brought in and good visualization of T8 was obtained in both AP and lateral projections. Patient identification and timeout procedures were completed and 10 cc 1% lidocaine was infiltrated on the right and left side for initial skin local. Spinal was after prepping draping the sterile manner repeat timeout procedures completed a spinal needle was used to get local down to the pedicle on the right side with a 50-50 mix of 1% Xylocaine and half percent Sensorcaine with epinephrine. After allowing this to set small incision was made and trocar advanced onto the pedicle on the right side. . Biopsy was obtained and drilling carried out. The balloon was inflated and approximate 1.5  cc of inflation carried out which gave some correction to endplate deformity. After the cement was mixed and the appropriate thickness approximately 2 cc of bone cement was infiltrated into the vertebral body crossed the midline and getting very good fill from superior to inferior endplates. After the cemented set the trochars removed and permanent C-arm views obtained. The wound closed with Dermabond followed by a Band-Aid  PLAN OF  CARE: Discharge to home after PACU  PATIENT DISPOSITION:  PACU - hemodynamically stable.

## 2016-12-23 NOTE — Anesthesia Post-op Follow-up Note (Cosign Needed)
Anesthesia QCDR form completed.        

## 2016-12-23 NOTE — Anesthesia Preprocedure Evaluation (Signed)
Anesthesia Evaluation  Patient identified by MRN, date of birth, ID band Patient awake    Reviewed: Allergy & Precautions, H&P , NPO status , Patient's Chart, lab work & pertinent test results, reviewed documented beta blocker date and time   History of Anesthesia Complications Negative for: history of anesthetic complications  Airway Mallampati: III  TM Distance: >3 FB Neck ROM: full    Dental  (+) Partial Upper, Partial Lower, Missing   Pulmonary neg pulmonary ROS,           Cardiovascular Exercise Tolerance: Good hypertension, (-) angina+ Peripheral Vascular Disease  (-) CAD, (-) Past MI, (-) Cardiac Stents and (-) CABG (-) dysrhythmias (-) Valvular Problems/Murmurs     Neuro/Psych negative neurological ROS  negative psych ROS   GI/Hepatic negative GI ROS, Neg liver ROS,   Endo/Other  negative endocrine ROS  Renal/GU negative Renal ROS  negative genitourinary   Musculoskeletal   Abdominal   Peds  Hematology negative hematology ROS (+)   Anesthesia Other Findings Past Medical History: No date: Arthritis No date: Hypercholesteremia No date: Hypertension   Reproductive/Obstetrics negative OB ROS                             Anesthesia Physical Anesthesia Plan  ASA: II  Anesthesia Plan: General   Post-op Pain Management:    Induction:   Airway Management Planned:   Additional Equipment:   Intra-op Plan:   Post-operative Plan:   Informed Consent: I have reviewed the patients History and Physical, chart, labs and discussed the procedure including the risks, benefits and alternatives for the proposed anesthesia with the patient or authorized representative who has indicated his/her understanding and acceptance.   Dental Advisory Given  Plan Discussed with: Anesthesiologist, CRNA and Surgeon  Anesthesia Plan Comments:         Anesthesia Quick Evaluation

## 2016-12-23 NOTE — H&P (Signed)
Reviewed paper H+P, will be scanned into chart. No changes noted.  

## 2016-12-24 ENCOUNTER — Encounter: Payer: Self-pay | Admitting: Orthopedic Surgery

## 2016-12-24 NOTE — Anesthesia Postprocedure Evaluation (Signed)
Anesthesia Post Note  Patient: Erin MikeVivienne Caldwell  Procedure(s) Performed: Procedure(s) (LRB): KYPHOPLASTY (N/A)  Patient location during evaluation: PACU Anesthesia Type: General Level of consciousness: awake and alert Pain management: pain level controlled Vital Signs Assessment: post-procedure vital signs reviewed and stable Respiratory status: spontaneous breathing, nonlabored ventilation, respiratory function stable and patient connected to nasal cannula oxygen Cardiovascular status: blood pressure returned to baseline and stable Postop Assessment: no signs of nausea or vomiting Anesthetic complications: no     Last Vitals:  Vitals:   12/23/16 1559 12/23/16 1630  BP: (!) 148/92 (!) 157/56  Pulse: 73 66  Resp: 18 18  Temp: 36.4 C     Last Pain:  Vitals:   12/24/16 0900  TempSrc:   PainSc: 0-No pain                 Lenard SimmerAndrew Ross Hefferan

## 2016-12-27 LAB — SURGICAL PATHOLOGY

## 2017-02-14 ENCOUNTER — Other Ambulatory Visit: Payer: Self-pay | Admitting: Family Medicine

## 2017-02-14 DIAGNOSIS — Z1231 Encounter for screening mammogram for malignant neoplasm of breast: Secondary | ICD-10-CM

## 2017-02-18 ENCOUNTER — Other Ambulatory Visit: Payer: Self-pay | Admitting: Internal Medicine

## 2017-02-18 DIAGNOSIS — M810 Age-related osteoporosis without current pathological fracture: Secondary | ICD-10-CM

## 2017-03-31 ENCOUNTER — Ambulatory Visit
Admission: RE | Admit: 2017-03-31 | Discharge: 2017-03-31 | Disposition: A | Discharge status: Home / Self Care | Payer: Medicare Other | Source: Ambulatory Visit | Attending: Internal Medicine | Admitting: Internal Medicine

## 2017-03-31 ENCOUNTER — Ambulatory Visit
Admission: RE | Admit: 2017-03-31 | Discharge: 2017-03-31 | Disposition: A | Discharge status: Home / Self Care | Payer: Medicare Other | Source: Ambulatory Visit | Attending: Family Medicine | Admitting: Family Medicine

## 2017-03-31 DIAGNOSIS — Z780 Asymptomatic menopausal state: Secondary | ICD-10-CM | POA: Diagnosis not present

## 2017-03-31 DIAGNOSIS — Z1231 Encounter for screening mammogram for malignant neoplasm of breast: Secondary | ICD-10-CM | POA: Diagnosis present

## 2017-03-31 DIAGNOSIS — Z13820 Encounter for screening for osteoporosis: Secondary | ICD-10-CM | POA: Diagnosis present

## 2017-03-31 DIAGNOSIS — M810 Age-related osteoporosis without current pathological fracture: Secondary | ICD-10-CM

## 2017-03-31 DIAGNOSIS — M8580 Other specified disorders of bone density and structure, unspecified site: Secondary | ICD-10-CM | POA: Diagnosis not present

## 2017-08-01 ENCOUNTER — Encounter (INDEPENDENT_AMBULATORY_CARE_PROVIDER_SITE_OTHER): Payer: Self-pay | Admitting: Vascular Surgery

## 2017-08-01 ENCOUNTER — Ambulatory Visit (INDEPENDENT_AMBULATORY_CARE_PROVIDER_SITE_OTHER): Payer: Medicare Other | Admitting: Vascular Surgery

## 2017-08-01 VITALS — BP 137/64 | HR 49 | Resp 16 | Ht 59.0 in | Wt 159.0 lb

## 2017-08-01 DIAGNOSIS — M79605 Pain in left leg: Secondary | ICD-10-CM | POA: Diagnosis not present

## 2017-08-01 DIAGNOSIS — I83893 Varicose veins of bilateral lower extremities with other complications: Secondary | ICD-10-CM | POA: Diagnosis not present

## 2017-08-01 DIAGNOSIS — M170 Bilateral primary osteoarthritis of knee: Secondary | ICD-10-CM

## 2017-08-01 DIAGNOSIS — I872 Venous insufficiency (chronic) (peripheral): Secondary | ICD-10-CM

## 2017-08-01 DIAGNOSIS — M79604 Pain in right leg: Secondary | ICD-10-CM

## 2017-08-01 DIAGNOSIS — M79606 Pain in leg, unspecified: Secondary | ICD-10-CM | POA: Insufficient documentation

## 2017-08-01 DIAGNOSIS — M4726 Other spondylosis with radiculopathy, lumbar region: Secondary | ICD-10-CM

## 2017-08-01 DIAGNOSIS — M47816 Spondylosis without myelopathy or radiculopathy, lumbar region: Secondary | ICD-10-CM | POA: Insufficient documentation

## 2017-08-01 DIAGNOSIS — M1710 Unilateral primary osteoarthritis, unspecified knee: Secondary | ICD-10-CM | POA: Insufficient documentation

## 2017-08-01 DIAGNOSIS — M179 Osteoarthritis of knee, unspecified: Secondary | ICD-10-CM | POA: Insufficient documentation

## 2017-08-01 NOTE — Progress Notes (Signed)
MRN : [id number]  Erin Caldwell is a 76 y.o. (Feb 09, 1941) female who presents with chief complaint of  Chief Complaint  Patient presents with  . Leg Pain  .  History of Present Illness: The patient is seen for evaluation of painful lower extremities. Patient notes the pain is variable and not associated with activity.  The pain is somewhat consistent day to day occurring primarily at night.  She notes the pain is in the right leg and occurs at night, in the early morning and when she rolls over it tends to go away.  The pain has been progressive over the past several years. The patient states these symptoms are causing  a profound negative impact on quality of life and daily activities.  The patient denies rest pain or dangling of an extremity off the side of the bed during the night for relief. No open wounds or sores at this time. No history of DVT or phlebitis. No prior interventions or surgeries.  There is a  history of back problems and DJD of the lumbar and sacral spine.   Current Meds  Medication Sig  . aspirin EC 81 MG tablet Take 81 mg by mouth every evening.   . Calcium-Vitamin D-Vitamin K ([employer] SOFT CHEWS CALCIUM PO) Take 1,200 mg by mouth daily.  . Cholecalciferol (VITAMIN D3) 2000 units capsule Take by mouth.  . denosumab (PROLIA) 60 MG/ML SOLN injection Inject subcutaneous one time. Refill in 6 months.  Marland Kitchen lisinopril (PRINIVIL,ZESTRIL) 10 MG tablet Take 10 mg by mouth daily.  . naproxen sodium (ANAPROX) 220 MG tablet Take by mouth.  . simvastatin (ZOCOR) 20 MG tablet Take 20 mg by mouth daily at 6 PM.   . TOPROL XL 100 MG 24 hr tablet Take 100 mg by mouth every evening.     Past Medical History:  Diagnosis Date  . Arthritis   . Hypercholesteremia   . Hypertension     Past Surgical History:  Procedure Laterality Date  . CATARACT EXTRACTION W/ INTRAOCULAR LENS  IMPLANT, BILATERAL    . KYPHOPLASTY N/A 12/23/2016   Procedure: KYPHOPLASTY;  Surgeon: Kennedy Bucker,  MD;  Location: [facility] ORS;  Service: Orthopedics;  Laterality: N/A;  T8    Social History Social History  Substance Use Topics  . Smoking status: Never Smoker  . Smokeless tobacco: Never Used  . Alcohol use No    Family History Family History  Problem Relation Age of Onset  . Breast cancer Sister        41's  . Breast cancer Maternal Aunt        50's    Allergies  Allergen Reactions  . Percocet [Oxycodone-Acetaminophen] Other (See Comments)    hallucinations  . Tetanus Toxoids Swelling     REVIEW OF SYSTEMS (Negative unless checked)  Constitutional: Weight loss  Fever  Chills Cardiac: Chest pain   Chest pressure   Palpitations   Shortness of breath when laying flat   Shortness of breath with exertion. Vascular:  Pain in legs with walking   Pain in legs at rest  History of DVT   Phlebitis   Swelling in legs   Varicose veins   Non-healing ulcers Pulmonary:   Uses home oxygen   Productive cough   Hemoptysis   Wheeze  COPD   Asthma Neurologic:  Dizziness   Seizures   History of stroke   History of TIA  Aphasia   Vissual changes   Weakness or numbness in arm     Weakness or numbness in leg Musculoskeletal:   Joint swelling   Joint pain   Low back pain Hematologic:  Easy bruising  Easy bleeding   Hypercoagulable state   Anemic Gastrointestinal:  Diarrhea   Vomiting  Gastroesophageal reflux/heartburn   Difficulty swallowing. Genitourinary:  Chronic kidney disease   Difficult urination  Frequent urination   Blood in urine Skin:  Rashes   Ulcers  Psychological:  History of anxiety    History of major depression.  Physical Examination  Vitals:   08/01/17 0931  BP: 137/64  Pulse: (!) 49  Resp: 16  Weight: 159 lb (72.1 kg)  Height:  (1.499 m)   Body mass index is 32.11 kg/m. Gen: WD/WN, NAD Head: [place]/AT, No temporalis wasting.  Ear/Nose/Throat: Hearing grossly intact, nares  w/o erythema or drainage Eyes: PER, EOMI, sclera nonicteric.  Neck: Supple, no large masses.   Pulmonary:  Good air movement, no audible wheezing bilaterally, no use of accessory muscles.  Cardiac: RRR, no JVD Vascular:  varicosities present extensively greater than 3 mm bilaterally.  Mild venous stasis changes to the legs bilaterally.  1+ soft pitting edema Vessel Right Left  Radial Palpable Palpable  PT Palpable Palpable  DP Trace Palpable Trace Palpable  Gastrointestinal: Non-distended. No guarding/no peritoneal signs.  Musculoskeletal: M/S 5/5 throughout.  No deformity or atrophy.  Neurologic: CN 2-12 intact. Symmetrical.  Speech is fluent. Motor exam as listed above. Psychiatric: Judgment intact, Mood & affect appropriate for pt's clinical situation. Dermatologic: No rashes or ulcers noted.  No changes consistent with cellulitis. Lymph : No lichenification or skin changes of chronic lymphedema.  CBC Lab Results  Component Value Date   WBC 7.9 12/22/2016   HGB 13.1 12/22/2016   HCT 39.0 12/22/2016   MCV 84.9 12/22/2016   PLT 158 12/22/2016    BMET    Component Value Date/Time   NA 142 12/22/2016 0843   K 4.4 12/22/2016 0843   CL 107 12/22/2016 0843   CO2 29 12/22/2016 0843   GLUCOSE 92 12/22/2016 0843   BUN 26 (H) 12/22/2016 0843   CREATININE 1.05 (H) 12/22/2016 0843   CALCIUM 9.6 12/22/2016 0843   GFRNONAA 50 (L) 12/22/2016 0843   GFRAA 58 (L) 12/22/2016 0843   CrCl cannot be calculated (Patient's most recent lab result is older than the maximum 21 days allowed.).  COAG No results found for: INR, PROTIME  Radiology No results found.    Assessment/Plan 1. Pain in both lower extremities  Recommend:  The patient has atypical pain symptoms for pure atherosclerotic disease. However, on physical exam there is evidence of mixed venous and arterial disease, given the diminished pulses and the edema associated with venous changes of the legs.  Noninvasive venous  ultrasound of the legs will be obtained and the patient will follow up with me to review these studies.  The patient should continue walking and begin a more formal exercise program. The patient should continue his antiplatelet therapy and aggressive treatment of the lipid abnormalities.  The patient should begin wearing graduated compression socks 15-20 mmHg strength to control edema.   - VAS [place] LOWER EXTREMITY VENOUS REFLUX; Future  2. Varicose veins of bilateral lower extremities with other complications No surgery or intervention at this point in time.    I have had a long discussion with the patient regarding venous insufficiency and why it  causes symptoms. I have discussed with the patient the chronic skin changes that accompany venous insufficiency and the long  term sequela such as infection and ulceration.  Patient will begin wearing graduated compression stockings class 1 (20-30 mmHg) or compression wraps on a daily basis a prescription was given. The patient will put the stockings on first thing in the morning and removing them in the evening. The patient is instructed specifically not to sleep in the stockings.    In addition, behavioral modification including several periods of elevation of the lower extremities during the day will be continued. I have demonstrated that proper elevation is a position with the ankles at heart level.  The patient is instructed to begin routine exercise, especially walking on a daily basis  Patient should undergo duplex ultrasound of the venous system to ensure that DVT or reflux is not present.  Following the review of the ultrasound the patient will follow up in 2-3 months to reassess the degree of swelling and the control that graduated compression stockings or compression wraps  is offering.     - VAS [place] LOWER EXTREMITY VENOUS REFLUX; Future  3. Chronic venous insufficiency No surgery or intervention at this point in time.    I have had a  long discussion with the patient regarding venous insufficiency and why it  causes symptoms. I have discussed with the patient the chronic skin changes that accompany venous insufficiency and the long term sequela such as infection and ulceration.  Patient will begin wearing graduated compression stockings class 1 (20-30 mmHg) or compression wraps on a daily basis a prescription was given. The patient will put the stockings on first thing in the morning and removing them in the evening. The patient is instructed specifically not to sleep in the stockings.    In addition, behavioral modification including several periods of elevation of the lower extremities during the day will be continued. I have demonstrated that proper elevation is a position with the ankles at heart level.  The patient is instructed to begin routine exercise, especially walking on a daily basis  Patient should undergo duplex ultrasound of the venous system to ensure that DVT or reflux is not present.  Following the review of the ultrasound the patient will follow up in 2-3 months to reassess the degree of swelling and the control that graduated compression stockings or compression wraps  is offering.   The patient can be assessed for a Lymph Pump at that time  4. Primary osteoarthritis of both knees Continue NSAID's as already ordered, these medications have been reviewed and there are no changes at this time.   5. Osteoarthritis of spine with radiculopathy, lumbar region Plan per Dr Luetta Nutting, MD  08/01/2017 10:40 AM

## 2017-08-29 ENCOUNTER — Ambulatory Visit (INDEPENDENT_AMBULATORY_CARE_PROVIDER_SITE_OTHER): Payer: Medicare Other

## 2017-08-29 ENCOUNTER — Ambulatory Visit (INDEPENDENT_AMBULATORY_CARE_PROVIDER_SITE_OTHER): Payer: Medicare Other | Admitting: Vascular Surgery

## 2017-08-29 ENCOUNTER — Encounter (INDEPENDENT_AMBULATORY_CARE_PROVIDER_SITE_OTHER): Payer: Self-pay | Admitting: Vascular Surgery

## 2017-08-29 VITALS — BP 138/63 | HR 53 | Resp 16 | Wt 162.0 lb

## 2017-08-29 DIAGNOSIS — I83893 Varicose veins of bilateral lower extremities with other complications: Secondary | ICD-10-CM | POA: Diagnosis not present

## 2017-08-29 DIAGNOSIS — I872 Venous insufficiency (chronic) (peripheral): Secondary | ICD-10-CM | POA: Diagnosis not present

## 2017-08-29 DIAGNOSIS — E7800 Pure hypercholesterolemia, unspecified: Secondary | ICD-10-CM

## 2017-08-29 DIAGNOSIS — I10 Essential (primary) hypertension: Secondary | ICD-10-CM | POA: Diagnosis not present

## 2017-08-29 DIAGNOSIS — M79604 Pain in right leg: Secondary | ICD-10-CM

## 2017-08-29 DIAGNOSIS — M79605 Pain in left leg: Secondary | ICD-10-CM

## 2017-08-31 NOTE — Progress Notes (Signed)
MRN : [id number]  Erin Caldwell is a 76 y.o. (03-Jun-1941) female who presents with chief complaint of  Chief Complaint  Patient presents with  . Follow-up    pt conv bil ven reflux  .  History of Present Illness: The patient returns to the office for followup evaluation regarding leg swelling.  The swelling has improved quite a bit and the pain associated with swelling has decreased substantially. There have not been any interval development of a ulcerations or wounds.  Since the previous visit the patient has been wearing graduated compression stockings and has noted little significant improvement in the lymphedema. The patient has been using compression routinely morning until night.  The patient also states elevation during the day and exercise is being done too.     Current Meds  Medication Sig  . aspirin EC 81 MG tablet Take 81 mg by mouth every evening.   . calcium carbonate (CALCIUM 600) 600 MG TABS tablet Take by mouth.  . Cholecalciferol (VITAMIN D3) 2000 units capsule Take by mouth.  . denosumab (PROLIA) 60 MG/ML SOLN injection Inject subcutaneous one time. Refill in 6 months.  Marland Kitchen lisinopril (PRINIVIL,ZESTRIL) 10 MG tablet Take 10 mg by mouth daily.  . naproxen sodium (ANAPROX) 220 MG tablet Take by mouth.  . simvastatin (ZOCOR) 20 MG tablet Take 20 mg by mouth daily at 6 PM.   . TOPROL XL 100 MG 24 hr tablet Take 100 mg by mouth every evening.     Past Medical History:  Diagnosis Date  . Arthritis   . Hypercholesteremia   . Hypertension     Past Surgical History:  Procedure Laterality Date  . CATARACT EXTRACTION W/ INTRAOCULAR LENS  IMPLANT, BILATERAL    . KYPHOPLASTY N/A 12/23/2016   Procedure: KYPHOPLASTY;  Surgeon: Kennedy Bucker, MD;  Location: [facility] ORS;  Service: Orthopedics;  Laterality: N/A;  T8    Social History Social History  Substance Use Topics  . Smoking status: Never Smoker  . Smokeless tobacco: Never Used  . Alcohol use No    Family  History Family History  Problem Relation Age of Onset  . Breast cancer Sister        34's  . Breast cancer Maternal Aunt        50's    Allergies  Allergen Reactions  . Percocet [Oxycodone-Acetaminophen] Other (See Comments)    hallucinations  . Tetanus Toxoids Swelling     REVIEW OF SYSTEMS (Negative unless checked)  Constitutional: [] Weight loss  [] Fever  [] Chills Cardiac: [] Chest pain   [] Chest pressure   [] Palpitations   [] Shortness of breath when laying flat   [] Shortness of breath with exertion. Vascular:  [] Pain in legs with walking   [] Pain in legs at rest  [] History of DVT   [] Phlebitis   [x] Swelling in legs   [x] Varicose veins   [] Non-healing ulcers Pulmonary:   [] Uses home oxygen   [] Productive cough   [] Hemoptysis   [] Wheeze  [] COPD   [] Asthma Neurologic:  [] Dizziness   [] Seizures   [] History of stroke   [] History of TIA  [] Aphasia   [] Vissual changes   [] Weakness or numbness in arm   [] Weakness or numbness in leg Musculoskeletal:   [] Joint swelling   [] Joint pain   [] Low back pain Hematologic:  [] Easy bruising  [] Easy bleeding   [] Hypercoagulable state   [] Anemic Gastrointestinal:  [] Diarrhea   [] Vomiting  [] Gastroesophageal reflux/heartburn   [] Difficulty swallowing. Genitourinary:  [] Chronic kidney disease   [] Difficult urination  []   Frequent urination   [] Blood in urine Skin:  [] Rashes   [] Ulcers  Psychological:  [] History of anxiety   []  History of major depression.  Physical Examination  Vitals:   08/29/17 1623  BP: 138/63  Pulse: (!) 53  Resp: 16  Weight: 162 lb (73.5 kg)   Body mass index is 32.72 kg/m. Gen: WD/WN, NAD Head: [place]/AT, No temporalis wasting.  Ear/Nose/Throat: Hearing grossly intact, nares w/o erythema or drainage Eyes: PER, EOMI, sclera nonicteric.  Neck: Supple, no large masses.   Pulmonary:  Good air movement, no audible wheezing bilaterally, no use of accessory muscles.  Cardiac: RRR, no JVD Vascular: scattered varicosities present  bilaterally.  Mild venous stasis changes to the legs bilaterally.  2+ soft pitting edema Vessel Right Left  Radial Palpable Palpable  PT Palpable Palpable  DP Palpable Palpable  Gastrointestinal: Non-distended. No guarding/no peritoneal signs.  Musculoskeletal: M/S 5/5 throughout.  No deformity or atrophy.  Neurologic: CN 2-12 intact. Symmetrical.  Speech is fluent. Motor exam as listed above. Psychiatric: Judgment intact, Mood & affect appropriate for pt's clinical situation. Dermatologic: venous rashes no ulcers noted.  No changes consistent with cellulitis. Lymph : No lichenification or skin changes of chronic lymphedema.  CBC Lab Results  Component Value Date   WBC 7.9 12/22/2016   HGB 13.1 12/22/2016   HCT 39.0 12/22/2016   MCV 84.9 12/22/2016   PLT 158 12/22/2016    BMET    Component Value Date/Time   NA 142 12/22/2016 0843   K 4.4 12/22/2016 0843   CL 107 12/22/2016 0843   CO2 29 12/22/2016 0843   GLUCOSE 92 12/22/2016 0843   BUN 26 (H) 12/22/2016 0843   CREATININE 1.05 (H) 12/22/2016 0843   CALCIUM 9.6 12/22/2016 0843   GFRNONAA 50 (L) 12/22/2016 0843   GFRAA 58 (L) 12/22/2016 0843   CrCl cannot be calculated (Patient's most recent lab result is older than the maximum 21 days allowed.).  COAG No results found for: INR, PROTIME  Radiology No results found.  Assessment/Plan 1. Chronic venous insufficiency No surgery or intervention at this point in time.  I have reviewed my discussion with the patient regarding venous insufficiency and why it causes symptoms. I have discussed with the patient the chronic skin changes that accompany venous insufficiency and the long term sequela such as ulceration. Patient will contnue wearing graduated compression stockings on a daily basis, as this has provided excellent control of his edema. The patient will put the stockings on first thing in the morning and removing them in the evening. The patient is reminded not to sleep in  the stockings.  In addition, behavioral modification including elevation during the day will be initiated. Exercise is strongly encouraged.  Given the patient's good control and lack of any problems regarding the venous insufficiency and lymphedema a lymph pump in not need at this time.  The patient will follow up with me PRN should anything change.  The patient voices agreement with this plan.   2. Varicose veins of bilateral lower extremities with other complications Recommend:  The patient is complaining of varicose veins.    I have had a long discussion with the patient regarding  varicose veins and why they cause symptoms.  Patient will begin wearing graduated compression stockings on a daily basis, beginning first thing in the morning and removing them in the evening. The patient is instructed specifically not to sleep in the stockings.    The patient  will also begin  using over-the-counter analgesics such as Motrin 600 mg po TID to help control the symptoms as needed.    In addition, behavioral modification including elevation during the day will be initiated, utilizing a recliner was recommended.  The patient is also instructed to continue exercising such as walking 4-5 times per week.  At this time the patient wishes to continue conservative therapy and is not interested in more invasive treatments such as laser ablation and sclerotherapy.  The Patient will follow up PRN if the symptoms worsen.  3. Essential hypertension Continue antihypertensive medications as already ordered, these medications have been reviewed and there are no changes at this time.   4. Pure hypercholesterolemia Continue statin as ordered and reviewed, no changes at this time    Levora DredgeGregory Minard Millirons, MD  08/31/2017 12:23 PM

## 2018-02-16 ENCOUNTER — Institutional Professional Consult (permissible substitution): Payer: Medicare Other | Admitting: Pulmonary Disease

## 2018-02-24 ENCOUNTER — Other Ambulatory Visit: Payer: Self-pay | Admitting: Family Medicine

## 2018-02-24 DIAGNOSIS — Z1231 Encounter for screening mammogram for malignant neoplasm of breast: Secondary | ICD-10-CM

## 2018-04-04 ENCOUNTER — Ambulatory Visit
Admission: RE | Admit: 2018-04-04 | Discharge: 2018-04-04 | Disposition: A | Discharge status: Home / Self Care | Payer: Medicare Other | Source: Ambulatory Visit | Attending: Family Medicine | Admitting: Family Medicine

## 2018-04-04 DIAGNOSIS — Z1231 Encounter for screening mammogram for malignant neoplasm of breast: Secondary | ICD-10-CM | POA: Diagnosis not present

## 2018-06-15 IMAGING — RF DG THORACIC SPINE 2V
1 series · 1 of 1 positions shown · non-contrast
Comparison: MR Tawnya of 12/15/2016

CLINICAL DATA: Kyphoplasty of the thoracic spine

EXAM:
THORACIC SPINE 2 VIEWS

[Series 1: run · 1 of 1 slices shown]
[im 1/1]
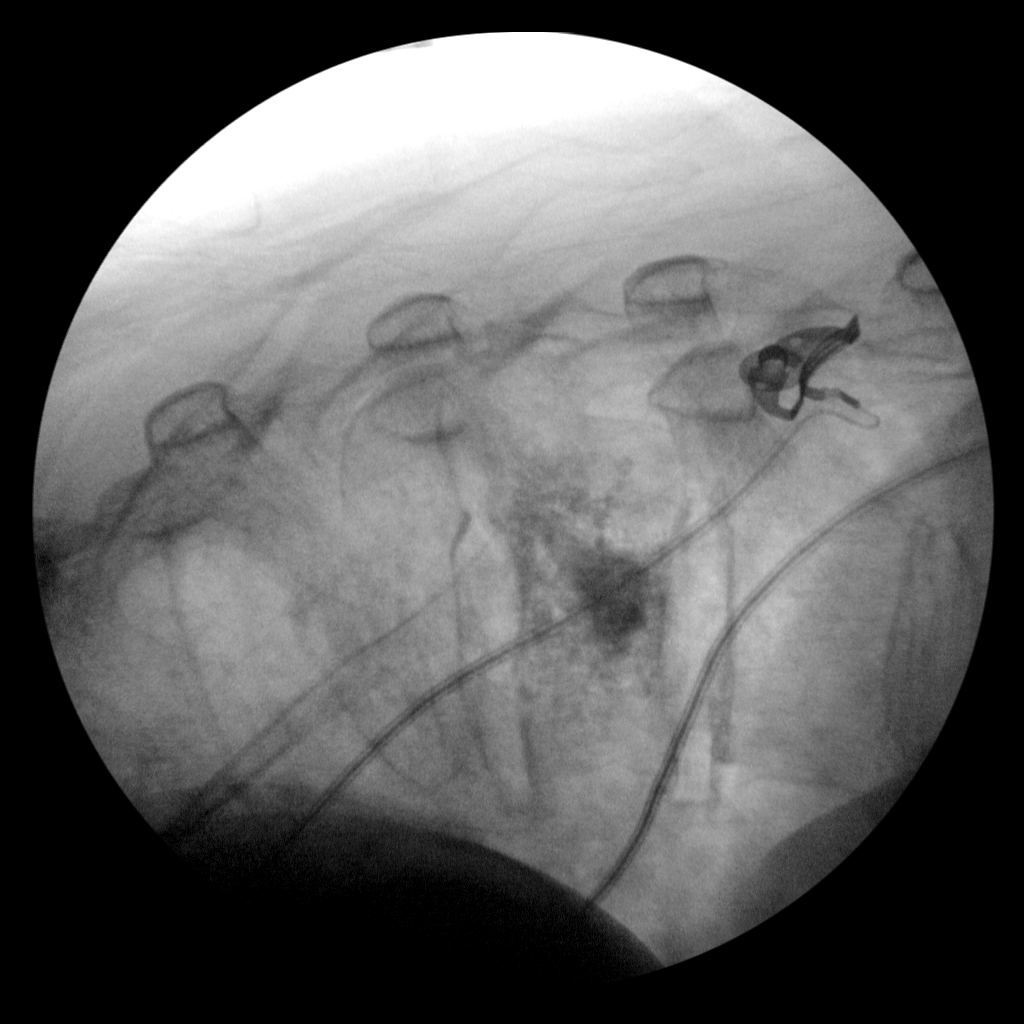

[1 of 1 positions shown; findings below may reference images not displayed]

FINDINGS: The limited field of view of the 2 C-arm spot films obtained make
assessment of level difficult. However compared to the MR, the level
kyphoplasty most likely is that of T8 vertebral body. No
complicating features are seen.
IMPRESSION: Two C-arm spot films show kyphoplasty apparently performed at the T8
level as noted above.
